# Patient Record
Sex: Female | Born: 1980
Health system: Southern US, Community
[De-identification: ages and names within clinical notes are randomized; demographics above are authoritative.]

## PROBLEM LIST (undated history)

## (undated) DIAGNOSIS — Z8619 Personal history of other infectious and parasitic diseases: Secondary | ICD-10-CM

## (undated) DIAGNOSIS — Z8669 Personal history of other diseases of the nervous system and sense organs: Secondary | ICD-10-CM

## (undated) HISTORY — DX: Personal history of other infectious and parasitic diseases: Z86.19

## (undated) HISTORY — DX: Personal history of other diseases of the nervous system and sense organs: Z86.69

---

## 2009-07-15 ENCOUNTER — Ambulatory Visit: Payer: Self-pay | Admitting: Family Medicine

## 2009-07-15 DIAGNOSIS — J309 Allergic rhinitis, unspecified: Secondary | ICD-10-CM | POA: Insufficient documentation

## 2009-07-15 DIAGNOSIS — D485 Neoplasm of uncertain behavior of skin: Secondary | ICD-10-CM | POA: Insufficient documentation

## 2009-07-15 DIAGNOSIS — M25549 Pain in joints of unspecified hand: Secondary | ICD-10-CM | POA: Insufficient documentation

## 2009-07-15 DIAGNOSIS — I7300 Raynaud's syndrome without gangrene: Secondary | ICD-10-CM | POA: Insufficient documentation

## 2009-07-16 LAB — CONVERTED CEMR LAB
Basophils Absolute: 0 10*3/uL (ref 0.0–0.1)
Eosinophils Relative: 1.4 % (ref 0.0–5.0)
HCT: 38.3 % (ref 36.0–46.0)
Hemoglobin: 13 g/dL (ref 12.0–15.0)
Lymphocytes Relative: 29.9 % (ref 12.0–46.0)
Lymphs Abs: 2.4 10*3/uL (ref 0.7–4.0)
Monocytes Relative: 6.7 % (ref 3.0–12.0)
Neutro Abs: 5 10*3/uL (ref 1.4–7.7)
Platelets: 294 10*3/uL (ref 150.0–400.0)
RDW: 13.6 % (ref 11.5–14.6)
Rhuematoid fact SerPl-aCnc: 20.7 intl units/mL — ABNORMAL HIGH (ref 0.0–20.0)
WBC: 8.1 10*3/uL (ref 4.5–10.5)

## 2009-07-20 LAB — CONVERTED CEMR LAB: Anti Nuclear Antibody(ANA): NEGATIVE

## 2009-07-21 ENCOUNTER — Encounter: Payer: Self-pay | Admitting: Family Medicine

## 2009-08-04 ENCOUNTER — Telehealth: Payer: Self-pay | Admitting: Family Medicine

## 2010-05-17 NOTE — Assessment & Plan Note (Signed)
Summary: new pt to estabh/dlo   Vital Signs:  Patient profile:   30 year old female Height:      65.25 inches Weight:      161.25 pounds BMI:     26.72 Temp:     98.4 degrees F oral Pulse rate:   68 / minute Pulse rhythm:   regular BP sitting:   104 / 68  (right arm) Cuff size:   regular  Vitals Entered By: Lewanda Rife LPN (July 15, 2009 11:17 AM) CC: New pt to establish   History of Present Illness: here to get established    has seen Dr Terrace Arabia - [facility]  lost a lot of wt 2 years ago - from 210 down to 140 (did it with hormone injections and lifestyle change and diet and exercise)  was having some paresthesias from crossing her legs -- tries not to do that now  foot drop as well  B12 and exercises help   has really cold hand and feet - practically numb  no color changes in her extremeties   does have pain in hand joints -- pain so bad she sometimes has to drop things  ? if lost sensation -- comes and goes -- is something that is new in the past year  no hx of carpal tunnel  more in the R hand  no deformity  more pain in mcp joints than any   has fam hx of aunt and GM with rheumatoid arthritis    has seen Dr Seymour Bars for obgyn  has a spot on her back to check -- ? 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 reynauds         neurol- Dr Terrace Arabia gyn- Dr Seymour Bars   Past Surgical History: childbirth 2005  Family History: Father: Living alcoholism Mother: Living Prolong QT interval syndrome Maternal  Grandmother:rheumatoid arthritis                                      breast cancer                                       elevated cholesterol                                       high blood pressure paternal Grandfather:Stroke Aunt:rheumatoid arthritis          breast cancer  Social History: [place]-- at [employer] / [employer] center  Single Alcohol use-yes Drug use-no G1P1 non smoker  has a son - in kindergarten  some intermittent exercise    Occupation:  employed Drug Use:  no  Review of Systems General:  Denies fatigue, fever, loss of appetite, and malaise. Eyes:  Denies blurring and eye pain. CV:  Denies chest pain or discomfort, leg cramps with exertion, and palpitations. Resp:  Denies cough and wheezing. GI:  Denies abdominal pain, bloody stools, change in bowel habits, and nausea. GU:  Denies discharge and dysuria. MS:  Complains of joint pain and stiffness; denies joint redness, joint swelling, muscle aches, cramps, and muscle weakness. Derm:  Denies lesion(s), poor wound healing, and rash. Neuro:  Complains of tingling; denies numbness. Psych:  Denies anxiety and depression. Endo:  Complains of cold intolerance; denies excessive thirst, excessive urination, and heat intolerance. Heme:  Denies abnormal bruising, bleeding, and enlarge lymph nodes.  Physical  Exam  General:  Well-developed,well-nourished,in no acute distress; alert,appropriate and cooperative throughout examination Head:  normocephalic, atraumatic, and no abnormalities observed.   Eyes:  vision grossly intact, pupils equal, pupils round, and pupils reactive to light.   Nose:  no nasal discharge.   Mouth:  pharynx pink and moist.   Neck:  supple with full rom and no masses or thyromegally, no JVD or carotid bruit  Chest Wall:  No deformities, masses, or tenderness noted. Lungs:  Normal respiratory effort, chest expands symmetrically. Lungs are clear to auscultation, no crackles or wheezes. Heart:  Normal rate and regular rhythm. S1 and S2 normal without gallop, murmur, click, rub or other extra sounds. Abdomen:  Bowel sounds positive,abdomen soft and non-tender without masses, organomegaly or hernias noted. Msk:  hands -no joint deformity slt tenderness mcps  nl rom  nl perfusion nl grip  no acute wrist changes Pulses:  R and L carotid,radial,femoral,dorsalis pedis and posterior tibial pulses are full and equal bilaterally Extremities:  No clubbing, cyanosis, edema, or deformity noted with normal full range of motion of all joints.   Neurologic:  strength normal in all extremities, sensation intact to light touch, gait normal, and DTRs symmetrical and normal.   Skin:  several nevi - under 5 mm back-- brown with heterogenous color and irregular shape  some blisters on feet healing from recent bad shoe  Cervical Nodes:  No lymphadenopathy noted Inguinal Nodes:  No significant adenopathy Psych:  normal affect, talkative and pleasant    Impression & Recommendations:  Problem # 1:  PAIN IN JOINT, HAND (ICD-719.44) Assessment New pain and stiffness in mcp joints with fam hx of RA and also some symptoms or raynauds  will check cbc and  rheum tests and update Orders: Venipuncture (16109) TLB-CBC Platelet - w/Differential (85025-CBCD) TLB-Rheumatoid Factor (RA)  (60454-UJ) TLB-Sedimentation Rate (ESR) (85652-ESR) T-Antinuclear Antib (ANA) (81191-47829)  Problem # 2:  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) Assessment: New nl exam and periph perfusion is nl  symptoms of extreme cold in hands and feet with pain are consistent with raynauds  checking rheum labs today disc need to avoid temp extremes  bp too low for ca channel blocker at this time  Orders: Venipuncture (56213) TLB-CBC Platelet - w/Differential (85025-CBCD) TLB-Rheumatoid Factor (RA) (08657-QI) TLB-Sedimentation Rate (ESR) (85652-ESR) T-Antinuclear Antib (ANA) (69629-52841) Specimen Handling (32440)  Problem # 3:  NEOPLASM, SKIN, UNCERTAIN BEHAVIOR (ICD-238.2) Assessment: New some nevi on back with irreg shape and color  ref to derm for eval Orders: Dermatology Referral (Derma)  Complete Medication List: 1)  Loestrin 24 Fe 1-20 Mg-mcg Tabs (Norethin ace-eth estrad-fe) .... Take 1 tablet by mouth once a day 2)  Latisse 0.03 % Soln (Bimatoprost) .... As directed  Patient Instructions: 1)  labs today for joint pain- testing for auto -immune problems 2)  keep feet and hands warm  3)  we will do derm referral at check out   Current Allergies (reviewed today): No known allergies

## 2010-05-17 NOTE — Consult Note (Signed)
Summary: [facility]   Imported By: Lanelle Bal 08/10/2009 09:01:58  _____________________________________________________________________  External Attachment:    Type:   Image     Comment:   External Document

## 2010-05-17 NOTE — Progress Notes (Signed)
  Phone Note Call from Patient   Caller: Patient Summary of Call: After several messages left for the patient to return our calls so that she could get set up for the hand xrays, she wants to find out what her deductible is before she can go for these xrays. I mailed her the xray order and she said she would  call back to let you know if she was going to have the xrays now or  wait a little while. Initial call taken by: Carlton Adam,  August 04, 2009 8:04 AM  Follow-up for Phone Call        ok- thanks for the update  Follow-up by: Judith Part MD,  August 04, 2009 11:08 PM

## 2011-06-05 ENCOUNTER — Telehealth: Payer: Self-pay | Admitting: Family Medicine

## 2011-06-05 NOTE — Telephone Encounter (Signed)
Have her f/u with me first - we usually start with some blood work and exam so rheumatology has something to go on  thanks

## 2011-06-05 NOTE — Telephone Encounter (Signed)
Patient was seen a couple years ago.  She is having pain in both hands and trouble grasping things with her hands and she's having pain in her knees and ankles.  She would like to be referred to a rheumatologist.  She has heard from several people about Dr.Shaili Deveshwar at [facility]  She has a family history of rheumatoid arthritis. Can patient be referred to Dr.Deveshwar?

## 2011-06-05 NOTE — Telephone Encounter (Signed)
Left message on voicemail-cell# for patient to call back

## 2011-06-05 NOTE — Telephone Encounter (Signed)
Pt had left v/m for me to call her back. I called pt back and left v/m for pt to call back tomorrow.

## 2011-06-06 NOTE — Telephone Encounter (Signed)
Patient notified as instructed by telephone. Pt said that it was too far for her to come for appt or to sign record release for rheumatology. I checked with Lyla Son and I cannot fax record release to be signed. Pt said she would find doctor closer to her.

## 2014-07-13 LAB — HM PAP SMEAR: HM Pap smear: NEGATIVE

## 2016-06-22 DIAGNOSIS — L237 Allergic contact dermatitis due to plants, except food: Secondary | ICD-10-CM | POA: Diagnosis not present

## 2016-09-20 DIAGNOSIS — D225 Melanocytic nevi of trunk: Secondary | ICD-10-CM | POA: Diagnosis not present

## 2016-09-20 DIAGNOSIS — L723 Sebaceous cyst: Secondary | ICD-10-CM | POA: Diagnosis not present

## 2016-09-20 DIAGNOSIS — L910 Hypertrophic scar: Secondary | ICD-10-CM | POA: Diagnosis not present

## 2016-09-20 DIAGNOSIS — D2262 Melanocytic nevi of left upper limb, including shoulder: Secondary | ICD-10-CM | POA: Diagnosis not present

## 2017-02-18 DIAGNOSIS — J018 Other acute sinusitis: Secondary | ICD-10-CM | POA: Diagnosis not present

## 2017-04-14 ENCOUNTER — Ambulatory Visit (INDEPENDENT_AMBULATORY_CARE_PROVIDER_SITE_OTHER): Payer: BLUE CROSS/BLUE SHIELD

## 2017-04-14 ENCOUNTER — Ambulatory Visit (HOSPITAL_COMMUNITY)
Admission: EM | Admit: 2017-04-14 | Discharge: 2017-04-14 | Disposition: A | Discharge status: Home / Self Care | Payer: BLUE CROSS/BLUE SHIELD | Attending: Family Medicine | Admitting: Family Medicine

## 2017-04-14 ENCOUNTER — Other Ambulatory Visit: Payer: Self-pay

## 2017-04-14 ENCOUNTER — Encounter (HOSPITAL_COMMUNITY): Payer: Self-pay | Admitting: Emergency Medicine

## 2017-04-14 DIAGNOSIS — M79672 Pain in left foot: Secondary | ICD-10-CM | POA: Diagnosis not present

## 2017-04-14 NOTE — Discharge Instructions (Signed)
Recommend seeing your primary doctor or returning here in one week if not seeing improvement.

## 2017-04-14 NOTE — ED Provider Notes (Signed)
  [facility]   [id number] 04/14/17 Arrival Time: 2035  ASSESSMENT & PLAN:  1. Foot pain, left    No fracture seen. Cast shoe given. Aleve BID with food. Will f/u in one week if not seeing significant improvement.  Reviewed expectations re: course of current medical issues. Questions answered. Outlined signs and symptoms indicating need for more acute intervention. Patient verbalized understanding. After Visit Summary given.   SUBJECTIVE: History from: patient. Andrea Armstrong is a 36 y.o. female who reports left foot pain. Dorsal and proximal foot. Worse after standing and walking. No injury. Wearing her regular shoes. Ibuprofen without much help. No extremity sensation changes or weakness. Better with rest. No skin changes. Describes pain as a dull ache.   ROS: As per HPI.   OBJECTIVE:  Vitals:   04/14/17 1400  BP: 123/86  Pulse: 66  Resp: 18  Temp: 98.7 F (37.1 C)  TempSrc: Oral  SpO2: 100%    General appearance: alert; no distress Extremities: no cyanosis or edema; symmetrical with no gross deformities; tenderness over her left dorsal, proximal foot with no swelling and no bruising; full range of motion at ankle CV: normal extremity capillary refill Skin: warm and dry Neurologic: normal gait; normal symmetric reflexes in all extremities; normal sensation in all extremities Psychological: alert and cooperative; normal mood and affect  Imaging: Dg Foot Complete Left  Result Date: 04/14/2017 CLINICAL DATA:  Dorsal left foot pain for 1 week.  No known injury. EXAM: LEFT FOOT - COMPLETE 3+ VIEW COMPARISON:  None. FINDINGS: No fracture or dislocation. No suspicious focal osseous lesion. No significant arthropathy. Small plantar left calcaneal spur. No radiopaque foreign bodies. IMPRESSION: No left foot fracture, malalignment or significant arthropathy. Small plantar left calcaneal spur. Electronically Signed   By: Ilona Sorrel M.D.   On: 04/14/2017 14:49     No Known Allergies  PMH: No h/o foot pain.  Social History   Socioeconomic History  . Marital status: Single    Spouse name: Not on file  . Number of children: Not on file  . Years of education: Not on file  . Highest education level: Not on file  Social Needs  . Financial resource strain: Not on file  . Food insecurity - worry: Not on file  . Food insecurity - inability: Not on file  . Transportation needs - medical: Not on file  . Transportation needs - non-medical: Not on file  Occupational History  . Not on file  Tobacco Use  . Smoking status: Never Smoker  Substance and Sexual Activity  . Alcohol use: Yes  . Drug use: No  . Sexual activity: Not on file  Other Topics Concern  . Not on file  Social History Narrative  . Not on file   Family History  Problem Relation Age of Onset  . Heart defect Mother       Vanessa Kick, MD 04/16/17 [phone]

## 2017-04-14 NOTE — ED Triage Notes (Signed)
Left foot pain for a week.  No known injury.  Pain in left foot, top of foot.  Pain gets worse throughout the day.  By the end of the day reports having a knot on top of foot

## 2017-05-02 DIAGNOSIS — Z01419 Encounter for gynecological examination (general) (routine) without abnormal findings: Secondary | ICD-10-CM | POA: Diagnosis not present

## 2017-05-02 DIAGNOSIS — Z6834 Body mass index (BMI) 34.0-34.9, adult: Secondary | ICD-10-CM | POA: Diagnosis not present

## 2017-09-12 ENCOUNTER — Ambulatory Visit: Payer: BLUE CROSS/BLUE SHIELD | Admitting: *Deleted

## 2017-10-03 DIAGNOSIS — D2262 Melanocytic nevi of left upper limb, including shoulder: Secondary | ICD-10-CM | POA: Diagnosis not present

## 2017-10-03 DIAGNOSIS — D224 Melanocytic nevi of scalp and neck: Secondary | ICD-10-CM | POA: Diagnosis not present

## 2017-10-03 DIAGNOSIS — D2372 Other benign neoplasm of skin of left lower limb, including hip: Secondary | ICD-10-CM | POA: Diagnosis not present

## 2017-10-03 DIAGNOSIS — L910 Hypertrophic scar: Secondary | ICD-10-CM | POA: Diagnosis not present

## 2017-10-03 DIAGNOSIS — D225 Melanocytic nevi of trunk: Secondary | ICD-10-CM | POA: Diagnosis not present

## 2017-11-08 NOTE — Progress Notes (Signed)
Subjective:    Andrea Armstrong is a 37 y.o. female and is here for a comprehensive physical exam.  Fatigue Patient complains of fatigue. Symptoms began several months ago. Symptoms of her fatigue have been decreased libido, general malaise and hypersomnolence. Patient describes the following psychological symptoms: stress at work. Patient denies change in hair texture, cold intolerance, constipation, excessive menstrual bleeding, exercise intolerance, fever, GI blood loss, significant change in weight, symptoms of arthritis, unusual rashes and witnessed or suspected sleep apnea. Symptoms have progressed to a point and plateaued. Symptom severity: struggles to carry out day to day responsibilities.. Previous visits for this problem: none.    Current Outpatient Medications:  .  Biotin 1 MG CAPS, biotin, Disp: , Rfl:  .  levonorgestrel (MIRENA, 52 MG,) 20 MCG/24HR IUD, Mirena 20 mcg/24 hours (5 yrs) 52 mg intrauterine device  Take by intrauterine route. Inserted 01/07/2013, needs to be removed 12/2017, Disp: , Rfl:   Health Maintenance Due  Topic Date Due  . HIV Screening  11/27/1993  . TETANUS/TDAP  11/27/1997  . PAP SMEAR  11/28/1999    PMHx, SurgHx, SocialHx, Medications, and Allergies were reviewed in the Visit Navigator and updated as appropriate.   Past Medical History:  Diagnosis Date  . History of chicken pox   . Hx of migraines     History reviewed. No pertinent surgical history.  Family History  Problem Relation Age of Onset  . Heart defect Mother        Prolong QT  . Alcohol abuse Father   . Hearing loss Father   . Arthritis Maternal Grandmother   . Depression Maternal Grandmother   . Alcohol abuse Paternal Grandmother     Social History   Tobacco Use  . Smoking status: Never Smoker  . Smokeless tobacco: Never Used  Substance Use Topics  . Alcohol use: Yes    Comment: occassional glass of wine  . Drug use: No    Review of Systems:   Pertinent items are  noted in the HPI. Otherwise, ROS is negative.  Objective:   BP 100/70 (BP Location: Left Arm, Patient Position: Sitting, Cuff Size: Large)   Pulse 77   Temp 98 F (36.7 C) (Oral)   Ht 5\' 6"  (1.676 m)   Wt 194 lb (88 kg)   SpO2 98%   BMI 31.31 kg/m    General appearance: alert, cooperative and appears stated age. Head: normocephalic, without obvious abnormality, atraumatic. Neck: no adenopathy, supple, symmetrical, trachea midline; thyroid not enlarged, symmetric, no tenderness/mass/nodules. Lungs: clear to auscultation bilaterally. Heart: regular rate and rhythm Abdomen: soft, non-tender; no masses,  no organomegaly. Extremities: extremities normal, atraumatic, no cyanosis or edema. Skin: skin color, texture, turgor normal, no rashes or lesions. Lymph: cervical, supraclavicular, and axillary nodes normal; no abnormal inguinal nodes palpated. Neurologic: grossly normal.  Assessment/Plan:   Andrea Armstrong was seen today for establish care and annual exam.  Diagnoses and all orders for this visit:  Routine physical examination  Need for prophylactic vaccination with combined diphtheria-tetanus-pertussis (DTP) vaccine -     Tdap vaccine greater than or equal to 7yo IM  Chronic fatigue -     CBC with Differential/Platelet -     Comprehensive metabolic panel -     Hemoglobin A1c -     TSH -     Vitamin B12 -     Iron, TIBC and Ferritin Panel  Screening for HIV (human immunodeficiency virus) -     HIV antibody  Screening  for lipid disorders -     Lipid panel  Vitamin D deficiency -     VITAMIN D 25 Hydroxy (Vit-D Deficiency, Fractures)    Patient Counseling:   [x]     Nutrition: Stressed importance of moderation in sodium/caffeine intake, saturated fat and cholesterol, caloric balance, sufficient intake of fresh fruits, vegetables, fiber, calcium, iron, and 1 mg of folate supplement per day (for females capable of pregnancy).   [x]      Stressed the importance of regular  exercise.    [x]     Substance Abuse: Discussed cessation/primary prevention of tobacco, alcohol, or other drug use; driving or other dangerous activities under the influence; availability of treatment for abuse.    [x]      Injury prevention: Discussed safety belts, safety helmets, smoke detector, smoking near bedding or upholstery.    [x]      Sexuality: Discussed sexually transmitted diseases, partner selection, use of condoms, avoidance of unintended pregnancy  and contraceptive alternatives.    [x]     Dental health: Discussed importance of regular tooth brushing, flossing, and dental visits.   [x]      Health maintenance and immunizations reviewed. Please refer to Health maintenance section.   Briscoe Deutscher, DO [place]

## 2017-11-09 ENCOUNTER — Encounter: Payer: Self-pay | Admitting: Family Medicine

## 2017-11-09 ENCOUNTER — Ambulatory Visit: Payer: BLUE CROSS/BLUE SHIELD | Admitting: Family Medicine

## 2017-11-09 ENCOUNTER — Other Ambulatory Visit: Payer: Self-pay

## 2017-11-09 VITALS — BP 100/70 | HR 77 | Temp 98.0°F | Ht 66.0 in | Wt 194.0 lb

## 2017-11-09 DIAGNOSIS — Z114 Encounter for screening for human immunodeficiency virus [HIV]: Secondary | ICD-10-CM

## 2017-11-09 DIAGNOSIS — Z23 Encounter for immunization: Secondary | ICD-10-CM

## 2017-11-09 DIAGNOSIS — E559 Vitamin D deficiency, unspecified: Secondary | ICD-10-CM | POA: Diagnosis not present

## 2017-11-09 DIAGNOSIS — R5382 Chronic fatigue, unspecified: Secondary | ICD-10-CM

## 2017-11-09 DIAGNOSIS — Z0000 Encounter for general adult medical examination without abnormal findings: Secondary | ICD-10-CM

## 2017-11-09 DIAGNOSIS — N946 Dysmenorrhea, unspecified: Secondary | ICD-10-CM | POA: Insufficient documentation

## 2017-11-09 DIAGNOSIS — Z13220 Encounter for screening for lipoid disorders: Secondary | ICD-10-CM

## 2017-11-09 DIAGNOSIS — R638 Other symptoms and signs concerning food and fluid intake: Secondary | ICD-10-CM | POA: Insufficient documentation

## 2017-11-09 DIAGNOSIS — A499 Bacterial infection, unspecified: Secondary | ICD-10-CM | POA: Insufficient documentation

## 2017-11-09 LAB — COMPREHENSIVE METABOLIC PANEL
ALT: 24 U/L (ref 0–35)
AST: 18 U/L (ref 0–37)
Albumin: 4.3 g/dL (ref 3.5–5.2)
Alkaline Phosphatase: 60 U/L (ref 39–117)
BUN: 18 mg/dL (ref 6–23)
CO2: 26 mEq/L (ref 19–32)
Calcium: 9.1 mg/dL (ref 8.4–10.5)
Chloride: 106 mEq/L (ref 96–112)
Creatinine, Ser: 0.7 mg/dL (ref 0.40–1.20)
GFR: 99.04 mL/min (ref >60.00)
Glucose, Bld: 96 mg/dL (ref 70–99)
Potassium: 4.6 mEq/L (ref 3.5–5.1)
Sodium: 139 mEq/L (ref 135–145)
Total Bilirubin: 0.4 mg/dL (ref 0.2–1.2)
Total Protein: 7 g/dL (ref 6.0–8.3)

## 2017-11-09 LAB — CBC WITH DIFFERENTIAL/PLATELET
Basophils Absolute: 0.1 10*3/uL (ref 0.0–0.1)
Basophils Relative: 0.8 % (ref 0.0–3.0)
Eosinophils Absolute: 0.2 10*3/uL (ref 0.0–0.7)
Eosinophils Relative: 2.1 % (ref 0.0–5.0)
HCT: 38.3 % (ref 36.0–46.0)
Hemoglobin: 13 g/dL (ref 12.0–15.0)
Lymphocytes Relative: 31.3 % (ref 12.0–46.0)
Lymphs Abs: 2.6 10*3/uL (ref 0.7–4.0)
MCHC: 33.9 g/dL (ref 30.0–36.0)
MCV: 89.1 fl (ref 78.0–100.0)
Monocytes Absolute: 0.6 10*3/uL (ref 0.1–1.0)
Monocytes Relative: 7.6 % (ref 3.0–12.0)
Neutro Abs: 4.9 10*3/uL (ref 1.4–7.7)
Neutrophils Relative %: 58.2 % (ref 43.0–77.0)
Platelets: 297 10*3/uL (ref 150.0–400.0)
RBC: 4.3 Mil/uL (ref 3.87–5.11)
RDW: 14.1 % (ref 11.5–15.5)
WBC: 8.3 10*3/uL (ref 4.0–10.5)

## 2017-11-09 LAB — HEMOGLOBIN A1C: Hgb A1c MFr Bld: 5.3 % (ref 4.6–6.5)

## 2017-11-09 LAB — LIPID PANEL
Cholesterol: 179 mg/dL (ref 0–200)
HDL: 60.5 mg/dL (ref >39.00)
LDL Cholesterol: 106 mg/dL — ABNORMAL HIGH (ref 0–99)
NonHDL: 118.23
Total CHOL/HDL Ratio: 3
Triglycerides: 63 mg/dL (ref 0.0–149.0)
VLDL: 12.6 mg/dL (ref 0.0–40.0)

## 2017-11-09 LAB — VITAMIN B12: Vitamin B-12: 399 pg/mL (ref 211–911)

## 2017-11-09 LAB — VITAMIN D 25 HYDROXY (VIT D DEFICIENCY, FRACTURES): VITD: 16.24 ng/mL — ABNORMAL LOW (ref 30.00–100.00)

## 2017-11-09 LAB — TSH: TSH: 2.18 u[IU]/mL (ref 0.35–4.50)

## 2017-11-10 LAB — IRON,TIBC AND FERRITIN PANEL
%SAT: 18 % (calc) (ref 16–45)
Ferritin: 15 ng/mL — ABNORMAL LOW (ref 16–154)
Iron: 74 ug/dL (ref 40–190)
TIBC: 419 mcg/dL (calc) (ref 250–450)

## 2017-11-10 LAB — HIV ANTIBODY (ROUTINE TESTING W REFLEX): HIV 1&2 Ab, 4th Generation: NONREACTIVE

## 2017-11-10 MED ORDER — CHOLECALCIFEROL 1.25 MG (50000 UT) PO TABS: ORAL_TABLET | ORAL | 0 refills | Status: AC

## 2017-11-12 ENCOUNTER — Telehealth: Payer: Self-pay | Admitting: Family Medicine

## 2017-11-12 DIAGNOSIS — Z23 Encounter for immunization: Secondary | ICD-10-CM

## 2017-11-12 NOTE — Telephone Encounter (Signed)
Patient returning call Copied from [place] [id number]. Topic: Quick Communication - Lab Results >> Nov 12, 2017 12:22 PM Francella Solian, CMA wrote: Called patient to inform them of 11/10/17 lab results. When patient returns call, triage nurse may disclose results.

## 2017-11-13 NOTE — Telephone Encounter (Signed)
Left message to call back regarding lab results.

## 2018-01-01 DIAGNOSIS — Z30431 Encounter for routine checking of intrauterine contraceptive device: Secondary | ICD-10-CM | POA: Diagnosis not present

## 2018-01-01 DIAGNOSIS — Z6834 Body mass index (BMI) 34.0-34.9, adult: Secondary | ICD-10-CM | POA: Diagnosis not present

## 2018-06-12 DIAGNOSIS — Z01419 Encounter for gynecological examination (general) (routine) without abnormal findings: Secondary | ICD-10-CM | POA: Diagnosis not present

## 2018-06-12 DIAGNOSIS — Z6836 Body mass index (BMI) 36.0-36.9, adult: Secondary | ICD-10-CM | POA: Diagnosis not present

## 2018-10-30 DIAGNOSIS — D225 Melanocytic nevi of trunk: Secondary | ICD-10-CM | POA: Diagnosis not present

## 2018-10-30 DIAGNOSIS — L910 Hypertrophic scar: Secondary | ICD-10-CM | POA: Diagnosis not present

## 2018-10-30 DIAGNOSIS — D224 Melanocytic nevi of scalp and neck: Secondary | ICD-10-CM | POA: Diagnosis not present

## 2018-10-30 DIAGNOSIS — L821 Other seborrheic keratosis: Secondary | ICD-10-CM | POA: Diagnosis not present

## 2019-01-12 IMAGING — DX DG FOOT COMPLETE 3+V*L*
3 series · 3 of 3 positions shown · non-contrast
Comparison: None.

CLINICAL DATA: Dorsal left foot pain for 1 week.  No known injury.

EXAM:
LEFT FOOT - COMPLETE 3+ VIEW

[foot ap]
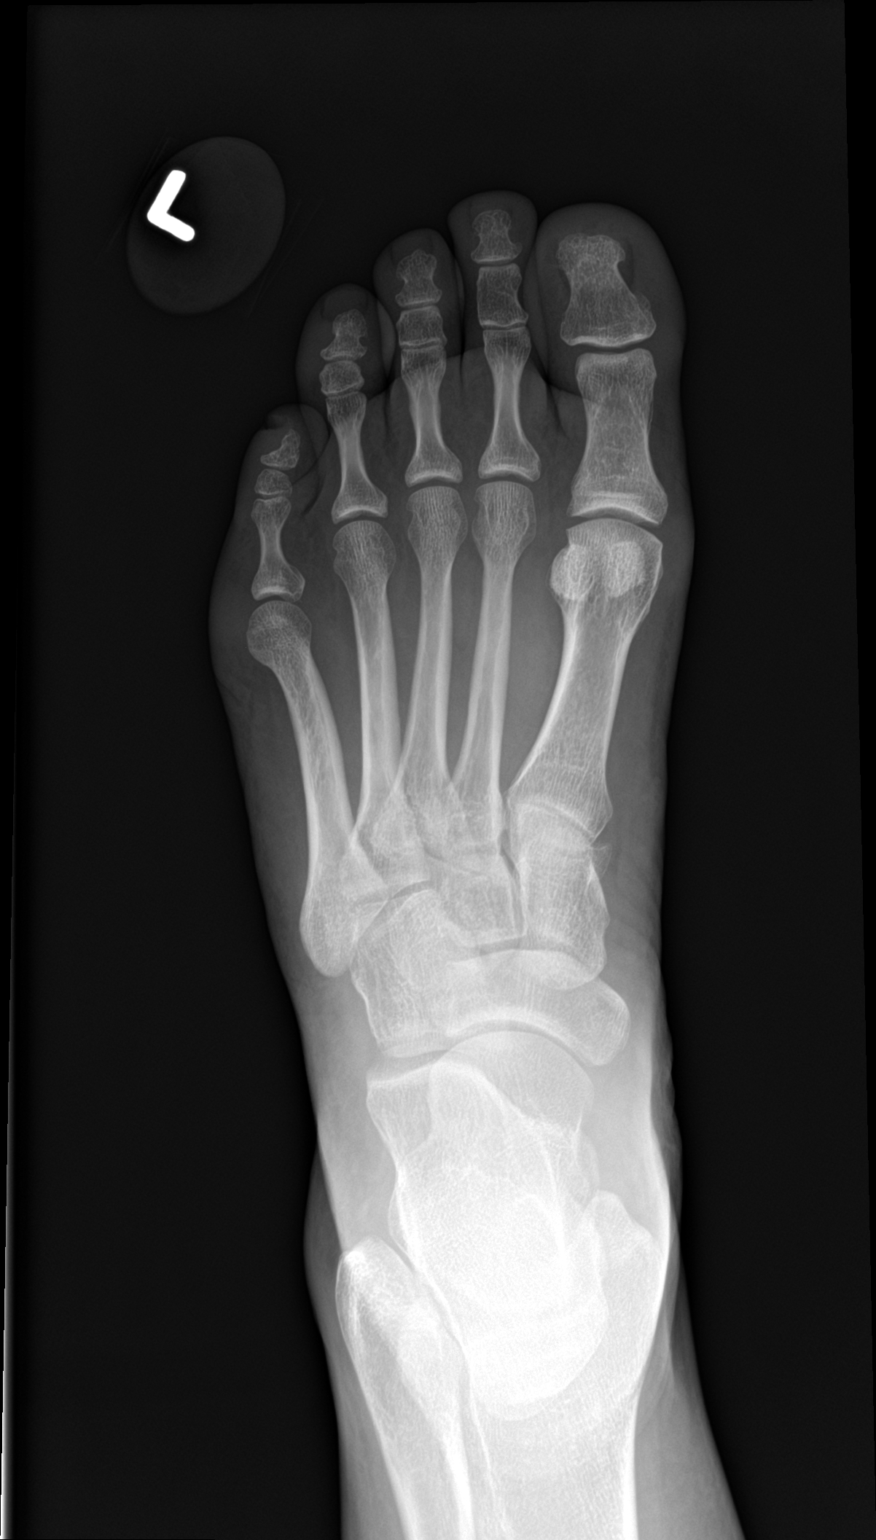

[foot obl]
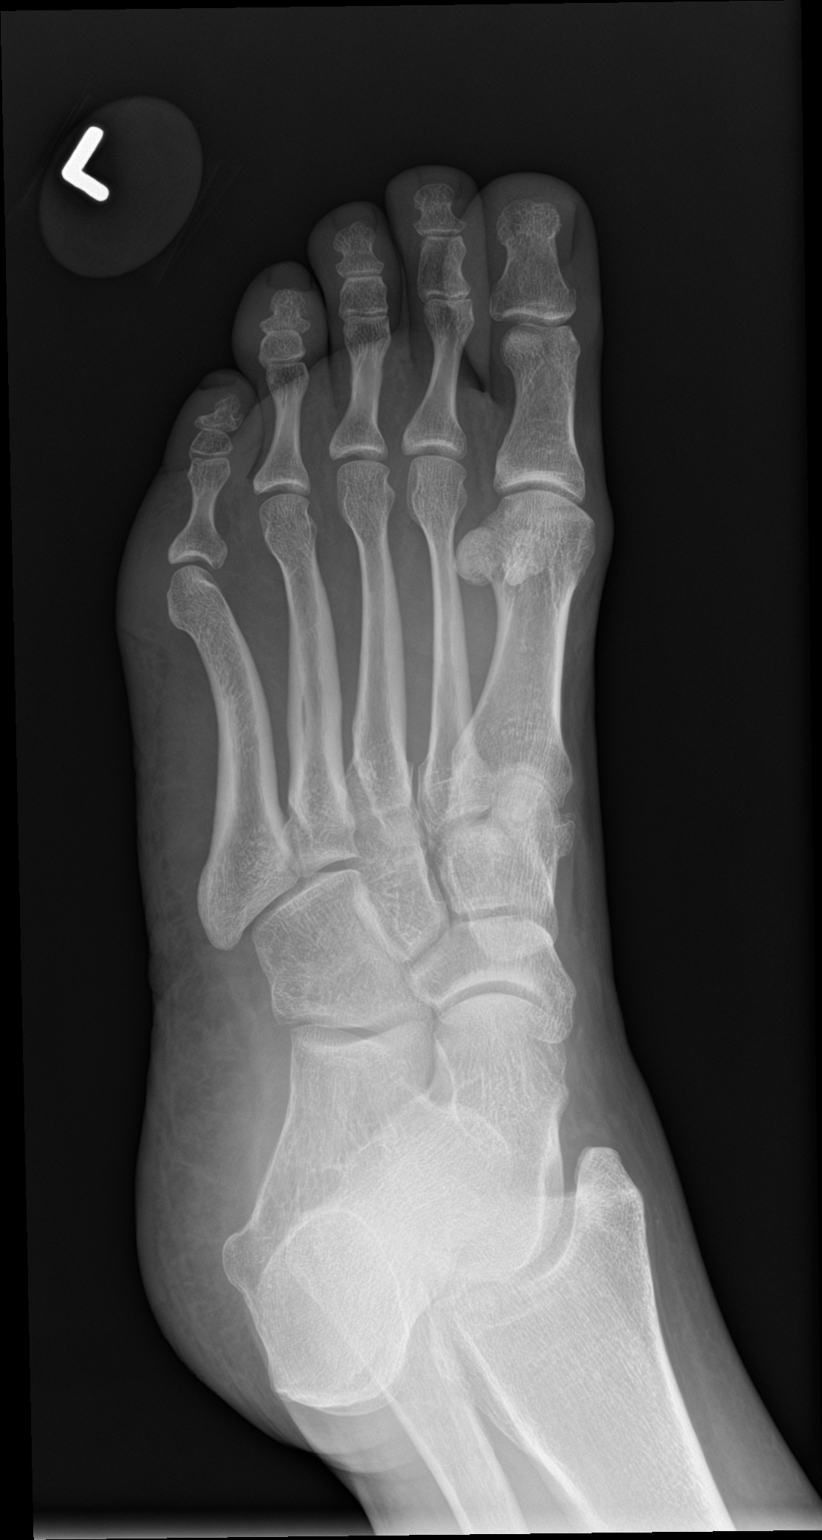

[foot lat]
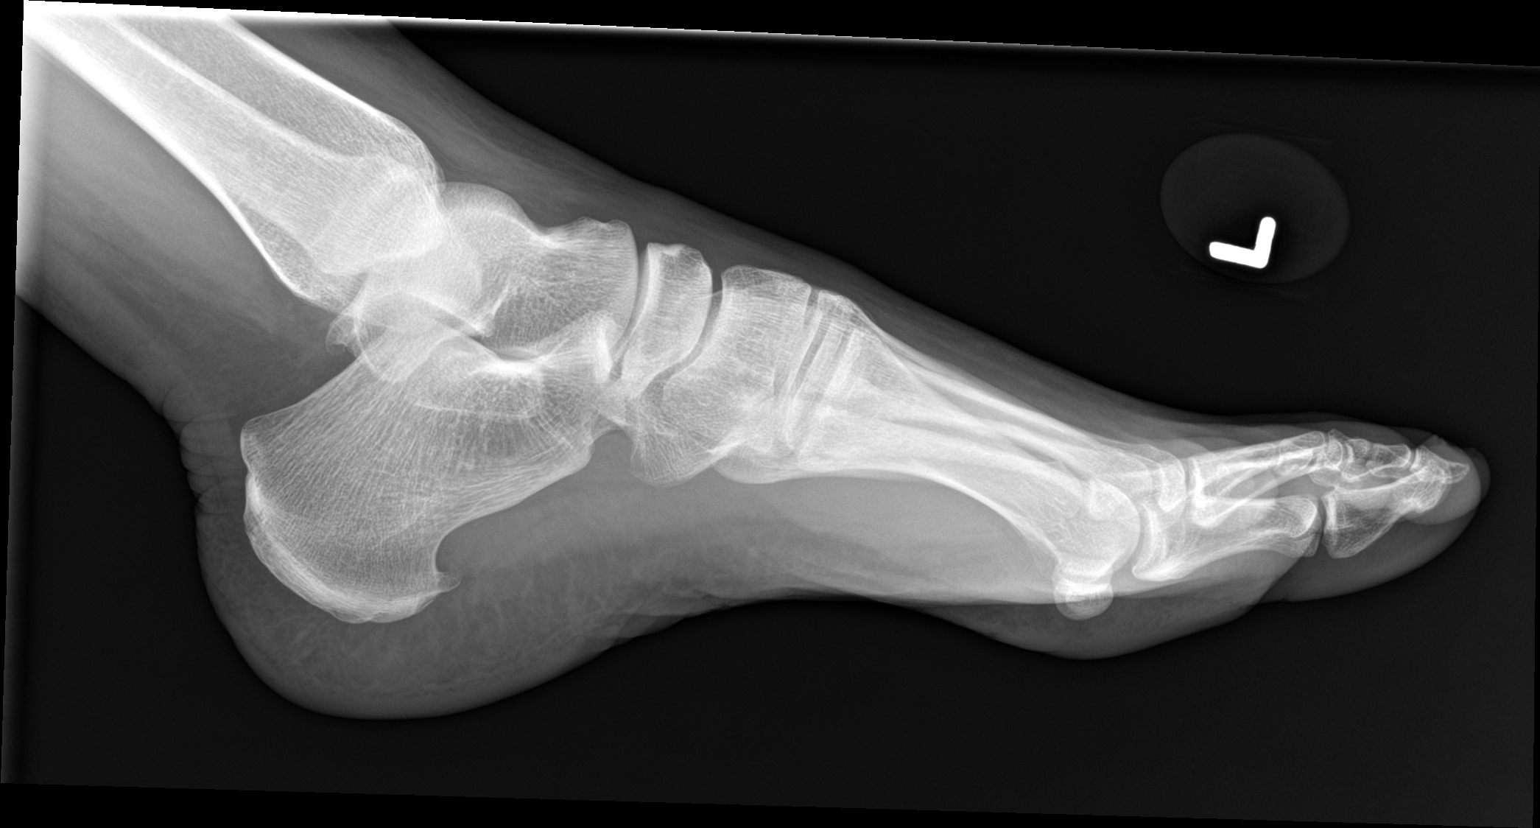

[3 of 3 positions shown; findings below may reference images not displayed]

FINDINGS: No fracture or dislocation. No suspicious focal osseous lesion. No
significant arthropathy. Small plantar left calcaneal spur. No
radiopaque foreign bodies.
IMPRESSION: No left foot fracture, malalignment or significant arthropathy.
Small plantar left calcaneal spur.

## 2019-04-24 ENCOUNTER — Ambulatory Visit: Payer: Commercial Managed Care - PPO | Attending: Internal Medicine

## 2019-04-24 DIAGNOSIS — Z20822 Contact with and (suspected) exposure to covid-19: Secondary | ICD-10-CM

## 2019-04-24 DIAGNOSIS — U071 COVID-19: Secondary | ICD-10-CM | POA: Insufficient documentation

## 2019-04-26 LAB — NOVEL CORONAVIRUS, NAA: SARS-CoV-2, NAA: DETECTED — AB

## 2020-04-20 ENCOUNTER — Ambulatory Visit (HOSPITAL_COMMUNITY): Admit: 2020-04-20 | Discharge status: Absent without leave | Payer: Self-pay

## 2020-04-20 ENCOUNTER — Other Ambulatory Visit: Payer: Self-pay

## 2020-04-20 ENCOUNTER — Encounter (HOSPITAL_COMMUNITY): Payer: Self-pay | Admitting: Emergency Medicine

## 2020-04-20 ENCOUNTER — Ambulatory Visit (HOSPITAL_COMMUNITY)
Admission: EM | Admit: 2020-04-20 | Discharge: 2020-04-20 | Disposition: A | Discharge status: Home / Self Care | Payer: Commercial Managed Care - PPO | Attending: Urgent Care | Admitting: Urgent Care

## 2020-04-20 DIAGNOSIS — J069 Acute upper respiratory infection, unspecified: Secondary | ICD-10-CM

## 2020-04-20 DIAGNOSIS — J3489 Other specified disorders of nose and nasal sinuses: Secondary | ICD-10-CM | POA: Diagnosis present

## 2020-04-20 DIAGNOSIS — U071 COVID-19: Secondary | ICD-10-CM | POA: Insufficient documentation

## 2020-04-20 MED ORDER — PREDNISONE 20 MG PO TABS: ORAL_TABLET | ORAL | 0 refills | Status: AC

## 2020-04-20 MED ORDER — CETIRIZINE HCL 10 MG PO TABS
10.0000 mg | ORAL_TABLET | Freq: Every day | ORAL | 0 refills | Status: AC
Start: 2020-04-20 — End: ?

## 2020-04-20 NOTE — ED Provider Notes (Signed)
[facility]   MRN: [id number] DOB: 1981/03/01  Subjective:   Andrea Armstrong is a 40 y.o. female presenting for 3-day history of acute onset sinus pressure, pressure around her eyes, runny nose, sore throat, hoarseness, fatigue, occasional cough.  Patient did a home Covid test and was negative.  She is Covid vaccinated.  Has been using DayQuil and NyQuil for her symptoms with minimal relief.  No current facility-administered medications for this encounter.  Current Outpatient Medications:  .  Biotin 1 MG CAPS, biotin, Disp: , Rfl:  .  Cholecalciferol 50000 units TABS, 50,000 units PO qwk for 12 weeks., Disp: 12 tablet, Rfl: 0 .  levonorgestrel (MIRENA, 52 MG,) 20 MCG/24HR IUD, Mirena 20 mcg/24 hours (5 yrs) 52 mg intrauterine device  Take by intrauterine route. Inserted 01/07/2013, needs to be removed 12/2017, Disp: , Rfl:    No Known Allergies  Past Medical History:  Diagnosis Date  . History of chicken pox   . Hx of migraines      History reviewed. No pertinent surgical history.  Family History  Problem Relation Age of Onset  . Heart defect Mother        Prolong QT  . Alcohol abuse Father   . Hearing loss Father   . Arthritis Maternal Grandmother   . Depression Maternal Grandmother   . Alcohol abuse Paternal Grandmother     Social History   Tobacco Use  . Smoking status: Never Smoker  . Smokeless tobacco: Never Used  Vaping Use  . Vaping Use: Never used  Substance Use Topics  . Alcohol use: Yes    Comment: occassional glass of wine  . Drug use: No    ROS   Objective:   Vitals: BP 132/71 (BP Location: Left Arm)   Pulse 93   Temp 98.6 F (37 C) (Oral)   Resp 16   SpO2 95%   Physical Exam Constitutional:      General: She is not in acute distress.    Appearance: Normal appearance. She is well-developed. She is not ill-appearing, toxic-appearing or diaphoretic.  HENT:     Head: Normocephalic and atraumatic.     Nose: Congestion and  rhinorrhea present.     Mouth/Throat:     Mouth: Mucous membranes are moist.     Pharynx: No oropharyngeal exudate or posterior oropharyngeal erythema.     Comments: Significant postnasal drainage overlying pharynx. Eyes:     General: No scleral icterus.       Right eye: No discharge.        Left eye: No discharge.     Extraocular Movements: Extraocular movements intact.     Conjunctiva/sclera: Conjunctivae normal.     Pupils: Pupils are equal, round, and reactive to light.  Cardiovascular:     Rate and Rhythm: Normal rate and regular rhythm.     Pulses: Normal pulses.     Heart sounds: Normal heart sounds. No murmur heard. No friction rub. No gallop.   Pulmonary:     Effort: Pulmonary effort is normal. No respiratory distress.     Breath sounds: Normal breath sounds. No stridor. No wheezing, rhonchi or rales.  Skin:    General: Skin is warm and dry.     Findings: No rash.  Neurological:     Mental Status: She is alert and oriented to person, place, and time.  Psychiatric:        Mood and Affect: Mood normal.  Behavior: Behavior normal.        Thought Content: Thought content normal.        Judgment: Judgment normal.      Assessment and Plan :   PDMP not reviewed this encounter.  1. Viral URI with cough   2. Sinus pressure     Patient has a history of an allergic rhinitis and I recommended Zyrtec and Sudafed.  However, patient would like aggressive management and therefore I obliged and will use a prednisone course with Zyrtec.  Recommended COVID-19 test, patient was very agreeable.  If there is no improvement in her symptoms by Friday, recommended oral antibiotic course. Counseled patient on potential for adverse effects with medications prescribed/recommended today, ER and return-to-clinic precautions discussed, patient verbalized understanding.    Wallis Bamberg, [place] 04/20/20 1913

## 2020-04-20 NOTE — ED Triage Notes (Signed)
Pt c/o cold sx onset 4 associated w/sinus pressure, runny nose, sore throat, fatigue  Denies f/v/n/d  Did a home COVID test yesterday morning and it was negative  Taking Dayquil and Nyquil temp relief.   A&O x4... NAD.Marland Kitchen. ambulatory

## 2020-04-20 NOTE — Discharge Instructions (Signed)

## 2020-04-21 LAB — SARS CORONAVIRUS 2 (TAT 6-24 HRS): SARS Coronavirus 2: POSITIVE — AB

## 2021-01-31 ENCOUNTER — Other Ambulatory Visit: Payer: Self-pay | Admitting: Obstetrics and Gynecology

## 2021-01-31 DIAGNOSIS — R928 Other abnormal and inconclusive findings on diagnostic imaging of breast: Secondary | ICD-10-CM

## 2021-02-12 ENCOUNTER — Ambulatory Visit
Admission: RE | Admit: 2021-02-12 | Discharge: 2021-02-12 | Disposition: A | Discharge status: Home / Self Care | Payer: Commercial Managed Care - PPO | Source: Ambulatory Visit | Attending: Obstetrics and Gynecology | Admitting: Obstetrics and Gynecology

## 2021-02-12 ENCOUNTER — Other Ambulatory Visit: Payer: Self-pay | Admitting: Obstetrics and Gynecology

## 2021-02-12 DIAGNOSIS — R928 Other abnormal and inconclusive findings on diagnostic imaging of breast: Secondary | ICD-10-CM

## 2021-02-12 DIAGNOSIS — N6489 Other specified disorders of breast: Secondary | ICD-10-CM

## 2021-02-16 ENCOUNTER — Other Ambulatory Visit: Payer: Commercial Managed Care - PPO

## 2022-04-04 ENCOUNTER — Other Ambulatory Visit: Payer: Self-pay | Admitting: Obstetrics and Gynecology

## 2022-04-04 DIAGNOSIS — N6489 Other specified disorders of breast: Secondary | ICD-10-CM

## 2022-06-02 ENCOUNTER — Ambulatory Visit: Payer: Commercial Managed Care - PPO

## 2022-06-02 ENCOUNTER — Ambulatory Visit
Admission: RE | Admit: 2022-06-02 | Discharge: 2022-06-02 | Disposition: A | Discharge status: Home / Self Care | Payer: Commercial Managed Care - PPO | Source: Ambulatory Visit | Attending: Obstetrics and Gynecology | Admitting: Obstetrics and Gynecology

## 2022-06-02 DIAGNOSIS — N6489 Other specified disorders of breast: Secondary | ICD-10-CM

## 2022-11-12 IMAGING — MG MM DIGITAL DIAGNOSTIC UNILAT*L* W/ TOMO W/ CAD
6 series · 6 of 18 positions shown · non-contrast
Comparison: Previous exam(s).

CLINICAL DATA: The patient was called back for a left breast
asymmetry.

EXAM:
DIGITAL DIAGNOSTIC UNILATERAL LEFT MAMMOGRAM WITH TOMOSYNTHESIS AND
CAD; ULTRASOUND LEFT BREAST LIMITED
TECHNIQUE: Left digital diagnostic mammography and breast tomosynthesis was
performed. The images were evaluated with computer-aided detection.;
Targeted ultrasound examination of the left breast was performed.

[L ML synth-2D]
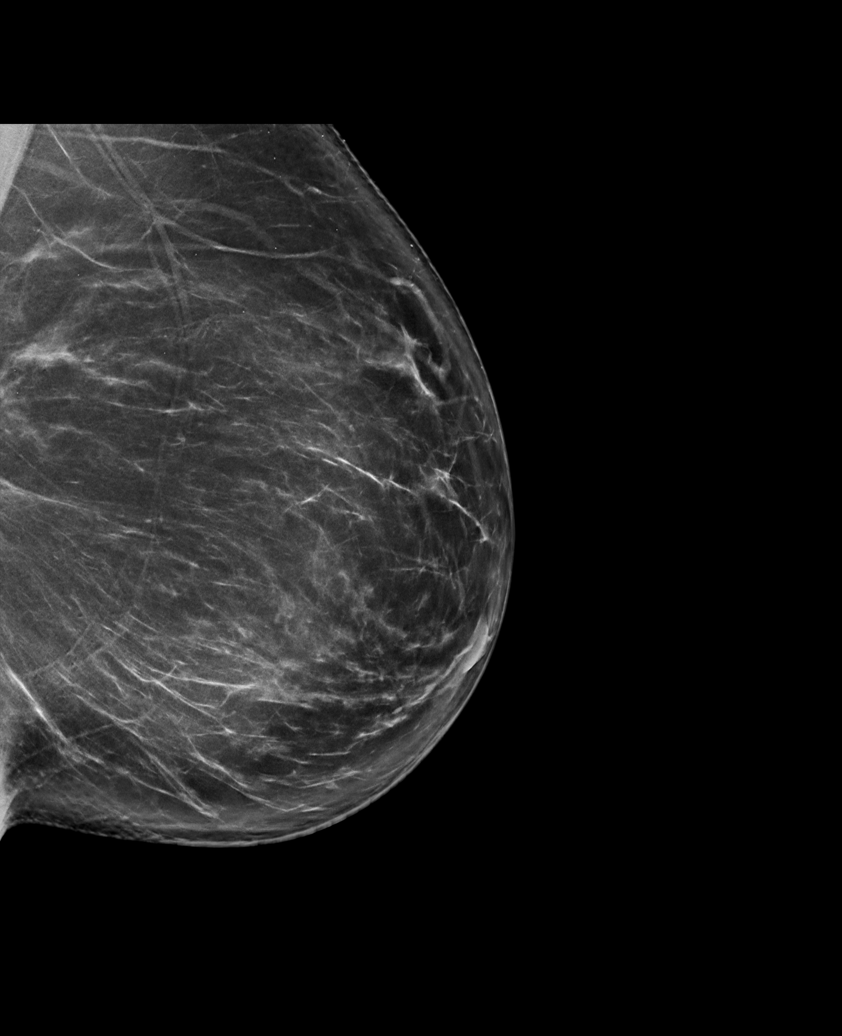

[L CC synth-2D]
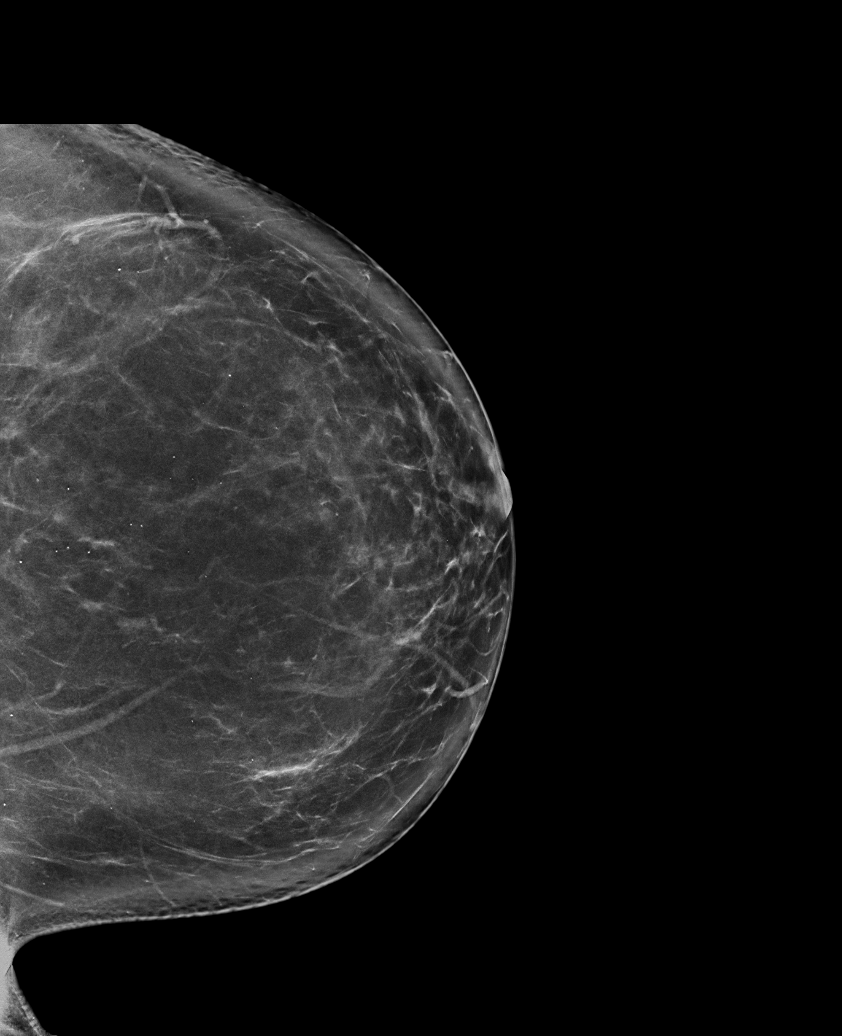

[L MLO synth-2D]
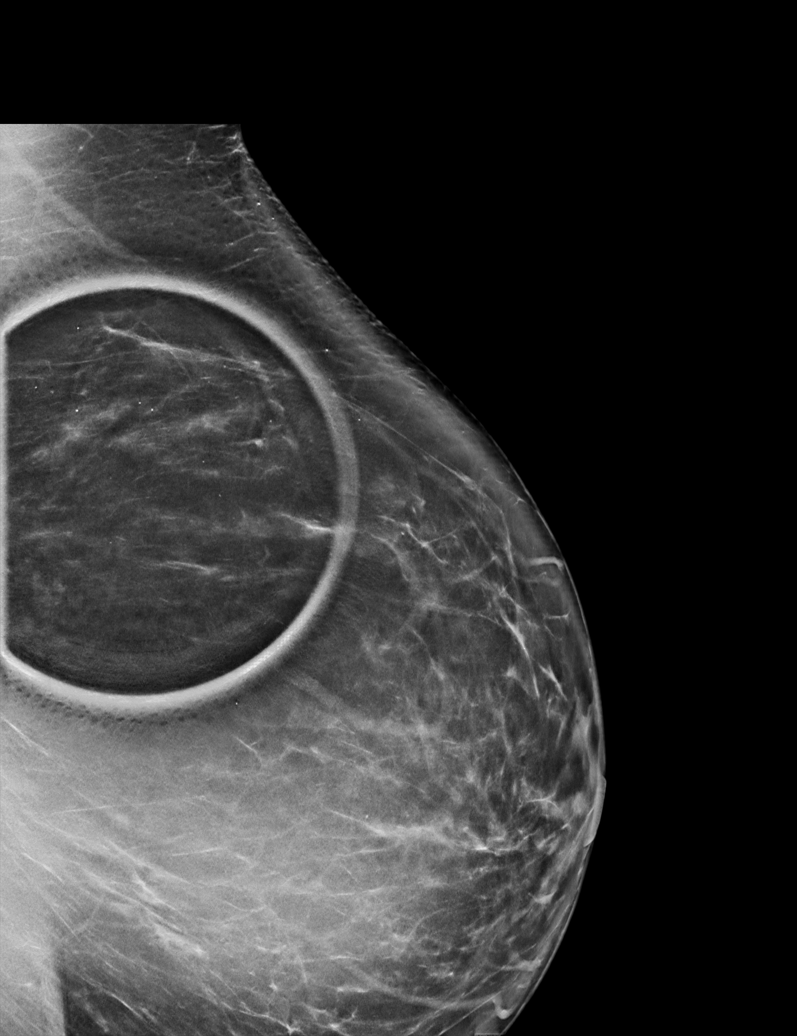

[L CC tomo · tomo slice 49/97.0]
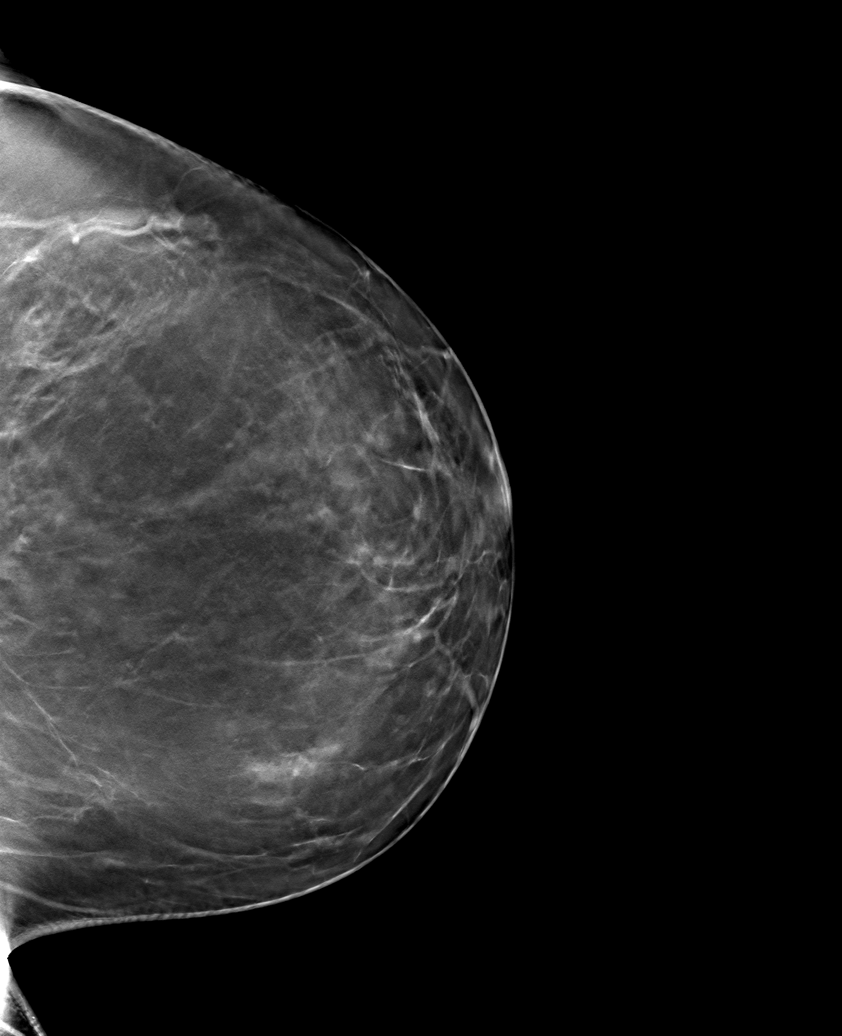

[L MLO tomo · tomo slice 49/97.0]
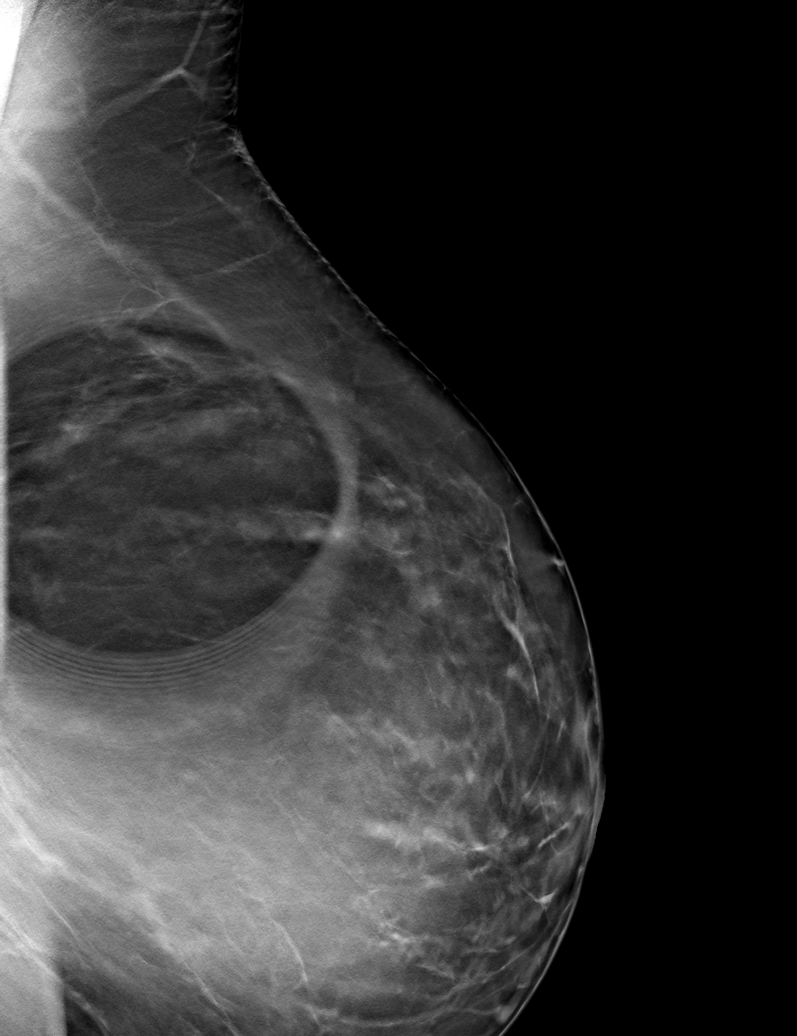

[L ML tomo · tomo slice 51/100.0]
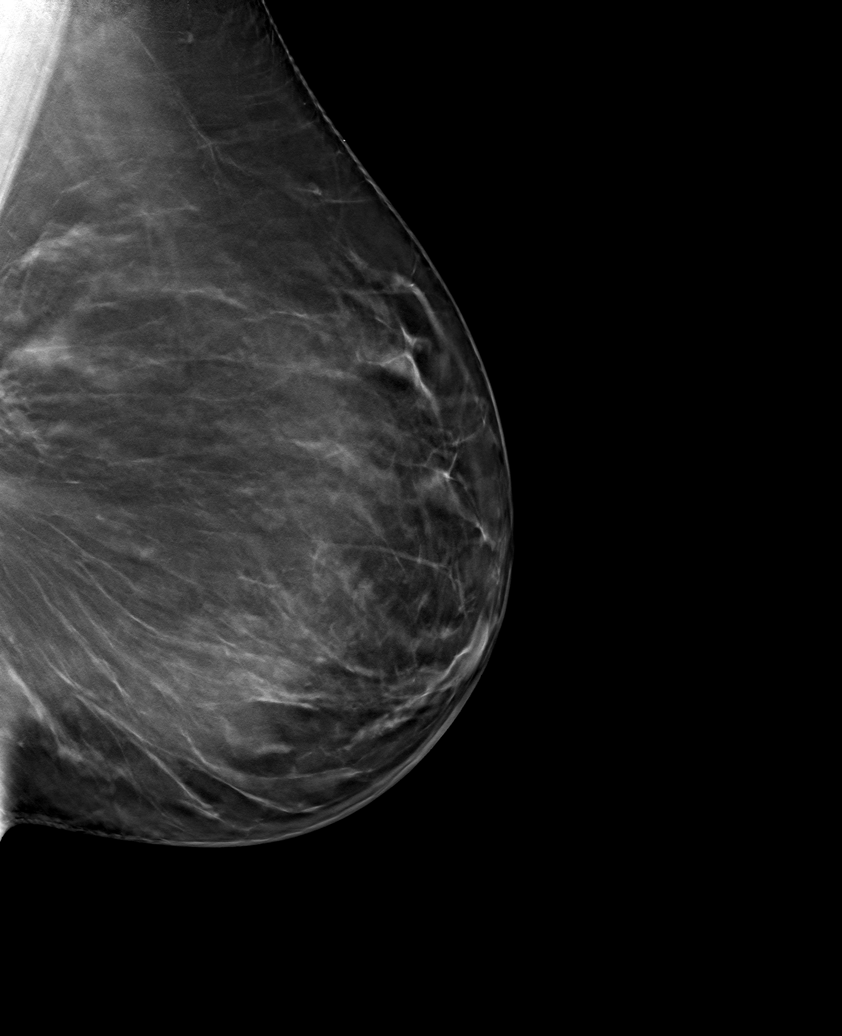

[6 of 18 positions shown; findings below may reference images not displayed]

ACR Breast Density Category b: There are scattered areas of
fibroglandular density.
FINDINGS: The left breast asymmetry persists but appears to represent
glandular tissue on today's mammographic imaging.

On physical exam, no suspicious lumps are identified.

Targeted ultrasound is performed, showing no sonographic
abnormalities in the upper outer left breast.
IMPRESSION: Persistent asymmetry in the upper outer left breast, thought to be
an island of glandular tissue, without sonographic correlate.

RECOMMENDATION:
Six-month follow-up mammography of the left breast asymmetry.

I have discussed the findings and recommendations with the patient.
If applicable, a reminder letter will be sent to the patient
regarding the next appointment.

BI-RADS CATEGORY  3: Probably benign.
